# Patient Record
Sex: Female | Born: 1985 | Race: White | Hispanic: No | Marital: Married | Smoking: Current every day smoker
Health system: Southern US, Community
[De-identification: ages and names within clinical notes are randomized; demographics above are authoritative.]

## PROBLEM LIST (undated history)

## (undated) DIAGNOSIS — T7840XA Allergy, unspecified, initial encounter: Secondary | ICD-10-CM

## (undated) DIAGNOSIS — N809 Endometriosis, unspecified: Secondary | ICD-10-CM

## (undated) HISTORY — PX: TOTAL ABDOMINAL HYSTERECTOMY: SHX209

---
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
## 2005-05-09 ENCOUNTER — Other Ambulatory Visit: Admission: RE | Admit: 2005-05-09 | Discharge: 2005-05-09 | Payer: Self-pay | Admitting: Obstetrics & Gynecology

## 2016-08-30 ENCOUNTER — Other Ambulatory Visit (HOSPITAL_COMMUNITY): Payer: Self-pay | Admitting: Obstetrics and Gynecology

## 2016-08-30 DIAGNOSIS — R102 Pelvic and perineal pain: Secondary | ICD-10-CM

## 2016-09-02 ENCOUNTER — Other Ambulatory Visit (HOSPITAL_COMMUNITY): Payer: Self-pay | Admitting: Obstetrics and Gynecology

## 2016-09-02 ENCOUNTER — Ambulatory Visit (HOSPITAL_COMMUNITY)
Admission: RE | Admit: 2016-09-02 | Discharge: 2016-09-02 | Disposition: A | Discharge status: Home / Self Care | Payer: BLUE CROSS/BLUE SHIELD | Source: Ambulatory Visit | Attending: Obstetrics and Gynecology | Admitting: Obstetrics and Gynecology

## 2016-09-02 ENCOUNTER — Encounter (INDEPENDENT_AMBULATORY_CARE_PROVIDER_SITE_OTHER): Payer: Self-pay

## 2016-09-02 DIAGNOSIS — R102 Pelvic and perineal pain: Secondary | ICD-10-CM

## 2016-09-02 DIAGNOSIS — N979 Female infertility, unspecified: Secondary | ICD-10-CM | POA: Diagnosis not present

## 2016-09-02 MED ORDER — IOPAMIDOL (ISOVUE-300) INJECTION 61%
30.0000 mL | Freq: Once | INTRAVENOUS | Status: AC | PRN
  Administered 2016-09-02: 1 mL

## 2020-12-09 ENCOUNTER — Encounter (HOSPITAL_COMMUNITY): Payer: Self-pay

## 2020-12-09 ENCOUNTER — Other Ambulatory Visit: Payer: Self-pay

## 2020-12-09 ENCOUNTER — Emergency Department (HOSPITAL_COMMUNITY)
Admission: EM | Admit: 2020-12-09 | Discharge: 2020-12-10 | Disposition: A | Discharge status: Home / Self Care | Payer: 59 | Attending: Emergency Medicine | Admitting: Emergency Medicine

## 2020-12-09 DIAGNOSIS — Z20822 Contact with and (suspected) exposure to covid-19: Secondary | ICD-10-CM | POA: Diagnosis not present

## 2020-12-09 DIAGNOSIS — N83202 Unspecified ovarian cyst, left side: Secondary | ICD-10-CM

## 2020-12-09 DIAGNOSIS — R1084 Generalized abdominal pain: Secondary | ICD-10-CM | POA: Insufficient documentation

## 2020-12-09 DIAGNOSIS — R102 Pelvic and perineal pain: Secondary | ICD-10-CM

## 2020-12-09 DIAGNOSIS — R197 Diarrhea, unspecified: Secondary | ICD-10-CM | POA: Insufficient documentation

## 2020-12-09 DIAGNOSIS — R109 Unspecified abdominal pain: Secondary | ICD-10-CM

## 2020-12-09 DIAGNOSIS — R112 Nausea with vomiting, unspecified: Secondary | ICD-10-CM | POA: Insufficient documentation

## 2020-12-09 DIAGNOSIS — F17200 Nicotine dependence, unspecified, uncomplicated: Secondary | ICD-10-CM | POA: Insufficient documentation

## 2020-12-09 LAB — CBC
HCT: 35.9 % — ABNORMAL LOW (ref 36.0–46.0)
Hemoglobin: 11.6 g/dL — ABNORMAL LOW (ref 12.0–15.0)
MCH: 30 pg (ref 26.0–34.0)
MCHC: 32.3 g/dL (ref 30.0–36.0)
MCV: 92.8 fL (ref 80.0–100.0)
Platelets: 242 10*3/uL (ref 150–400)
RBC: 3.87 MIL/uL (ref 3.87–5.11)
RDW: 13.8 % (ref 11.5–15.5)
WBC: 17 10*3/uL — ABNORMAL HIGH (ref 4.0–10.5)
nRBC: 0 % (ref 0.0–0.2)

## 2020-12-09 LAB — URINALYSIS, ROUTINE W REFLEX MICROSCOPIC
Bilirubin Urine: NEGATIVE
Glucose, UA: NEGATIVE mg/dL
Ketones, ur: NEGATIVE mg/dL
Leukocytes,Ua: NEGATIVE
Nitrite: NEGATIVE
Protein, ur: NEGATIVE mg/dL
Specific Gravity, Urine: >1.03 — ABNORMAL HIGH (ref 1.005–1.030)
pH: 5.5 (ref 5.0–8.0)

## 2020-12-09 LAB — COMPREHENSIVE METABOLIC PANEL
ALT: 16 U/L (ref 0–44)
AST: 17 U/L (ref 15–41)
Albumin: 4.3 g/dL (ref 3.5–5.0)
Alkaline Phosphatase: 89 U/L (ref 38–126)
Anion gap: 10 (ref 5–15)
BUN: 14 mg/dL (ref 6–20)
CO2: 23 mmol/L (ref 22–32)
Calcium: 9 mg/dL (ref 8.9–10.3)
Chloride: 103 mmol/L (ref 98–111)
Creatinine, Ser: 0.81 mg/dL (ref 0.44–1.00)
GFR, Estimated: >60 mL/min (ref >60)
Glucose, Bld: 120 mg/dL — ABNORMAL HIGH (ref 70–99)
Potassium: 3.7 mmol/L (ref 3.5–5.1)
Sodium: 136 mmol/L (ref 135–145)
Total Bilirubin: 0.4 mg/dL (ref 0.3–1.2)
Total Protein: 7.9 g/dL (ref 6.5–8.1)

## 2020-12-09 LAB — I-STAT BETA HCG BLOOD, ED (MC, WL, AP ONLY): I-stat hCG, quantitative: <5 m[IU]/mL (ref <5)

## 2020-12-09 LAB — LIPASE, BLOOD: Lipase: 25 U/L (ref 11–51)

## 2020-12-09 LAB — URINALYSIS, MICROSCOPIC (REFLEX)
Bacteria, UA: NONE SEEN
WBC, UA: NONE SEEN WBC/hpf (ref 0–5)

## 2020-12-09 MED ORDER — ONDANSETRON 4 MG PO TBDP
4.0000 mg | ORAL_TABLET | Freq: Once | ORAL | Status: AC | PRN
  Administered 2020-12-09: 21:00:00 4 mg via ORAL
  Filled 2020-12-09: qty 1

## 2020-12-09 NOTE — ED Triage Notes (Signed)
Pt reports severe abdominal pain suddenly starting tonight at 1930 while at church. Pt says pain is generalized affecting upper and lower quadrants.

## 2020-12-10 ENCOUNTER — Emergency Department (HOSPITAL_COMMUNITY): Payer: 59

## 2020-12-10 DIAGNOSIS — F17200 Nicotine dependence, unspecified, uncomplicated: Secondary | ICD-10-CM | POA: Diagnosis not present

## 2020-12-10 DIAGNOSIS — Z20822 Contact with and (suspected) exposure to covid-19: Secondary | ICD-10-CM | POA: Diagnosis not present

## 2020-12-10 DIAGNOSIS — R109 Unspecified abdominal pain: Secondary | ICD-10-CM | POA: Diagnosis present

## 2020-12-10 DIAGNOSIS — R112 Nausea with vomiting, unspecified: Secondary | ICD-10-CM | POA: Diagnosis not present

## 2020-12-10 DIAGNOSIS — R197 Diarrhea, unspecified: Secondary | ICD-10-CM | POA: Diagnosis not present

## 2020-12-10 DIAGNOSIS — R1084 Generalized abdominal pain: Secondary | ICD-10-CM | POA: Diagnosis not present

## 2020-12-10 LAB — GC/CHLAMYDIA PROBE AMP (~~LOC~~) NOT AT ARMC
Chlamydia: NEGATIVE
Comment: NEGATIVE
Comment: NORMAL
Neisseria Gonorrhea: NEGATIVE

## 2020-12-10 LAB — WET PREP, GENITAL
Clue Cells Wet Prep HPF POC: NONE SEEN
Sperm: NONE SEEN
Trich, Wet Prep: NONE SEEN
Yeast Wet Prep HPF POC: NONE SEEN

## 2020-12-10 LAB — RESP PANEL BY RT-PCR (FLU A&B, COVID) ARPGX2
Influenza A by PCR: NEGATIVE
Influenza B by PCR: NEGATIVE
SARS Coronavirus 2 by RT PCR: NEGATIVE

## 2020-12-10 MED ORDER — SODIUM CHLORIDE 0.9 % IV BOLUS (SEPSIS)
1000.0000 mL | Freq: Once | INTRAVENOUS | Status: AC
  Administered 2020-12-10: 01:00:00 1000 mL via INTRAVENOUS

## 2020-12-10 MED ORDER — LACTATED RINGERS IV BOLUS
1000.0000 mL | Freq: Once | INTRAVENOUS | Status: AC
  Administered 2020-12-10: 05:00:00 1000 mL via INTRAVENOUS

## 2020-12-10 MED ORDER — SODIUM CHLORIDE 0.9 % IV SOLN: INTRAVENOUS | Status: DC

## 2020-12-10 MED ORDER — IOHEXOL 300 MG/ML  SOLN
100.0000 mL | Freq: Once | INTRAMUSCULAR | Status: AC | PRN
  Administered 2020-12-10: 100 mL via INTRAVENOUS

## 2020-12-10 MED ORDER — MORPHINE SULFATE (PF) 4 MG/ML IV SOLN: 4.0000 mg | INTRAVENOUS | Status: DC | PRN

## 2020-12-10 MED ORDER — OXYCODONE-ACETAMINOPHEN 5-325 MG PO TABS
1.0000 | ORAL_TABLET | Freq: Once | ORAL | Status: AC
  Administered 2020-12-10: 05:00:00 1 via ORAL
  Filled 2020-12-10: qty 1

## 2020-12-10 MED ORDER — ONDANSETRON HCL 4 MG/2ML IJ SOLN
4.0000 mg | Freq: Once | INTRAMUSCULAR | Status: AC
  Administered 2020-12-10: 01:00:00 4 mg via INTRAVENOUS
  Filled 2020-12-10: qty 2

## 2020-12-10 MED ORDER — OXYCODONE-ACETAMINOPHEN 5-325 MG PO TABS: 1.0000 | ORAL_TABLET | Freq: Four times a day (QID) | ORAL | 0 refills | Status: DC | PRN

## 2020-12-10 NOTE — ED Provider Notes (Signed)
TIME SEEN: 12:09 AM  CHIEF COMPLAINT: Abdominal pain, vomiting and diarrhea  HPI: Patient is a 34 year old female with no significant past medical history who presents to the emergency department with diffuse sharp abdominal pain that she describes as severe in nature that started between 7 and 7:30 PM.  Reports she had nausea, vomiting and diarrhea.  No fevers or chills.  No sick contacts or recent travel.  Denies dysuria, hematuria, vaginal bleeding or discharge.  No history of previous abdominal surgeries.  Reports feeling better after receiving Zofran.  ROS: See HPI Constitutional: no fever  Eyes: no drainage  ENT: no runny nose   Cardiovascular:  no chest pain  Resp: no SOB  GI: Vomiting and diarrhea GU: no dysuria Integumentary: no rash  Allergy: no hives  Musculoskeletal: no leg swelling  Neurological: no slurred speech ROS otherwise negative  PAST MEDICAL HISTORY/PAST SURGICAL HISTORY:  History reviewed. No pertinent past medical history.  MEDICATIONS:  Prior to Admission medications   Not on File    ALLERGIES:  No Known Allergies  SOCIAL HISTORY:  Social History   Tobacco Use  . Smoking status: Current Every Day Smoker  . Smokeless tobacco: Never Used  Substance Use Topics  . Alcohol use: Not Currently    FAMILY HISTORY: No family history on file.  EXAM: BP 99/84 (BP Location: Left Arm)   Pulse (!) 102   Temp 98.6 F (37 C) (Oral)   Resp 16   Ht 5\' 5"  (1.651 m)   Wt 74.8 kg   SpO2 100%   BMI 27.46 kg/m  CONSTITUTIONAL: Alert and oriented and responds appropriately to questions. Well-appearing; well-nourished HEAD: Normocephalic EYES: Conjunctivae clear, pupils appear equal, EOM appear intact ENT: normal nose; moist mucous membranes NECK: Supple, normal ROM CARD: RRR; S1 and S2 appreciated; no murmurs, no clicks, no rubs, no gallops RESP: Normal chest excursion without splinting or tachypnea; breath sounds clear and equal bilaterally; no wheezes,  no rhonchi, no rales, no hypoxia or respiratory distress, speaking full sentences ABD/GI: Normal bowel sounds; non-distended; soft, diffusely tender with intermittent voluntary guarding, no rebound GU:  Normal external genitalia. No lesions, rashes noted.  Patient has minimal amount of brown vaginal discharge seen in her posterior vaginal vault.  No other discharge noted.  Patient has left adnexal tenderness without fullness or mass appreciated.  No right adnexal tenderness, mass or fullness, no cervical motion tenderness. Cervix is not appear friable.  Cervix is closed.  Chaperone present for exam. BACK:  The back appears normal EXT: Normal ROM in all joints; no deformity noted, no edema; no cyanosis SKIN: Normal color for age and race; warm; no rash on exposed skin NEURO: Moves all extremities equally PSYCH: The patient's mood and manner are appropriate.   MEDICAL DECISION MAKING: Patient here with diffuse abdominal tenderness, vomiting and diarrhea.  Differential includes viral gastroenteritis, colitis, diverticulitis.  She has no localized tenderness on exam.  No GU symptoms.  Labs show a leukocytosis of 17,000 but otherwise unremarkable.  Urine shows no sign of infection.  Pregnancy test negative.  We will proceed with CT imaging.  She declines any pain medicine at this time stating she is feeling better request a second dose of Zofran.  ED PROGRESS: Contacted by Dr. with radiology.  Patient has a large left ovarian cyst and signs concerning for a left ovarian torsion.  Pelvic exam performed patient does have a small amount of dark red blood in the vaginal vault and has left adnexal  tenderness without mass appreciated.  Will discuss with her OB/GYN on-call.  She sees Dr. Waynard Reeds with [facility].  No previous history of pregnancy or STD.  Monogamous with one female partner.  1:40 AM  Spoke with Dr. Tenny Craw on call for [facility].  Patient will need to go to [facility] admissions.   Will discuss with [facility] for emergent transfer.   We will keep patient n.p.o.  Covid swab pending.  Will start IV fluids.  She declines any pain medication at this time.  Patient updated with plan of care.   2:05 AM  [facility]  [facility] at bedside prior to ultrasound being completed.  I had spoke to Dr. Tenny Craw.  [facility] obtained at [facility].  CRITICAL CARE Performed by: Rochele Raring   Total critical care time: 55 minutes  Critical care time was exclusive of separately billable procedures and treating other patients.  Critical care was necessary to treat or prevent imminent or life-threatening deterioration.  Critical care was time spent personally by me on the following activities: development of treatment plan with patient and/or surrogate as well as nursing, discussions with consultants, evaluation of patient's response to treatment, examination of patient, obtaining history from patient or surrogate, ordering and performing treatments and interventions, ordering and review of laboratory studies, ordering and review of radiographic studies, pulse oximetry and re-evaluation of patient's condition.   Claudia Fuller was evaluated in Emergency Department on 12/10/2020 for the symptoms described in the history of present illness. She was evaluated in the context of the global COVID-19 pandemic, which necessitated consideration that the patient might be at risk for infection with the SARS-CoV-2 virus that causes COVID-19. Institutional protocols and algorithms that pertain to the evaluation of patients at risk for COVID-19 are in a state of rapid change based on information released by regulatory bodies including the CDC and federal and state organizations. These policies and algorithms were followed during the patient's care in the ED.      [name], [name], DO 12/10/20 [phone]

## 2020-12-10 NOTE — ED Provider Notes (Signed)
2:38 AM Assumed care from Dr. Elesa Massed, please see their note for full history, physical and decision making until this point. In brief this is a 34 y.o. year old female who presented to the ED tonight with Abdominal Pain     Apparently patient was supposed to go to [facility] for [place] and Gyn consultation however they refused to accept her on arrival so is now in ED.  I immediately called Dr. Tenny Craw who stated patient should be in [facility] but stated to get an [place] then call back with results.   [place] ok. Pain controlled the whole time in ED. Discussed with Dr. Tenny Craw who recommends discharge w/ pain meds and close follow up which her office will try to coordinate. Described symptoms to patient and husband.   Discharge instructions, including strict return precautions for new or worsening symptoms, given. Patient and/or family verbalized understanding and agreement with the plan as described.   Labs, studies and imaging reviewed by myself and considered in medical decision making if ordered. Imaging interpreted by radiology.  Labs Reviewed  WET PREP, GENITAL - Abnormal; Notable for the following components:      Result Value   WBC, Wet Prep HPF POC FEW (*)    All other components within normal limits  COMPREHENSIVE METABOLIC PANEL - Abnormal; Notable for the following components:   Glucose, Bld 120 (*)    All other components within normal limits  CBC - Abnormal; Notable for the following components:   WBC 17.0 (*)    Hemoglobin 11.6 (*)    HCT 35.9 (*)    All other components within normal limits  URINALYSIS, ROUTINE W REFLEX MICROSCOPIC - Abnormal; Notable for the following components:   Specific Gravity, Urine >1.030 (*)    Hgb urine dipstick TRACE (*)    All other components within normal limits  RESP PANEL BY RT-PCR (FLU A&B, COVID) ARPGX2  LIPASE, BLOOD  URINALYSIS, MICROSCOPIC (REFLEX)  I-STAT BETA HCG BLOOD, ED ([facility], [facility], [facility] ONLY)  GC/CHLAMYDIA PROBE AMP ([place]) NOT AT [facility]    CT ABDOMEN PELVIS  W CONTRAST  Final Result  Addendum 1 of 1  ADDENDUM REPORT: 12/10/2020 01:34    ADDENDUM:  These results were called by telephone at the time of interpretation  on 12/10/2020 at 1:34 am to provider <Referring (Ordering)  Provider>, who verbally acknowledged these results.      Electronically Signed    By: Kreg Shropshire M.D.    On: 12/10/2020 01:34      Final    US Pelvis Complete    (Results Pending)  US Transvaginal Non-OB    (Results Pending)  [place] Art/Ven Flow Abd Pelv Doppler    (Results Pending)    No follow-ups on file.    Claudia Fuller, Claudia Cower, MD 12/11/20 0330

## 2020-12-10 NOTE — ED Notes (Signed)
Report given to [facility] in pt room.

## 2020-12-10 NOTE — ED Notes (Signed)
Attempted to call report to [facility]. Was placed on hold for 41sec and was disconnected.

## 2020-12-10 NOTE — ED Notes (Signed)
Attempted to call report, Charge Nurse unable to take report at this time.

## 2020-12-10 NOTE — ED Notes (Signed)
Patient transported to Ultrasound 

## 2020-12-10 NOTE — ED Notes (Signed)
Family called and notified that pt will be going to [facility]

## 2020-12-10 NOTE — ED Notes (Addendum)
Pt arrives via [facility], VSS, [facility] gave 4mg  morphine PTA

## 2020-12-15 MED FILL — Morphine Sulfate Inj 4 MG/ML: INTRAMUSCULAR | Qty: 1 | Status: AC

## 2021-11-29 IMAGING — CT CT ABD-PELV W/ CM
2 of 4 series · 12 of 46 positions shown, 14 images · IV contrast (OMNIPAQUE 300)
Comparison: None.
COMPARISON: None.

Addendum:
CLINICAL DATA: Nonlocalized abdominal pain

EXAM:
CT ABDOMEN AND PELVIS WITH CONTRAST
TECHNIQUE: Multidetector CT imaging of the abdomen and pelvis was performed
using the standard protocol following bolus administration of
intravenous contrast.
CONTRAST:  100mL OMNIPAQUE IOHEXOL 300 MG/ML  SOLN

[Series 2: axial st · axial · 0.68mm/px · z∈[+1092,+1488]mm · 9 of 94 slices shown, 11 images]
[im 10/94  soft-tissue]
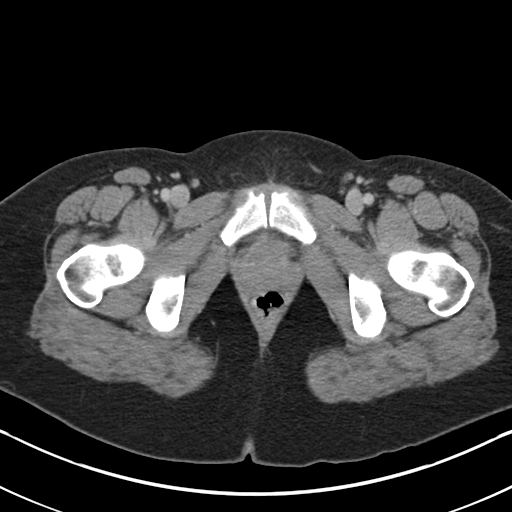
[im 10/94  bone]
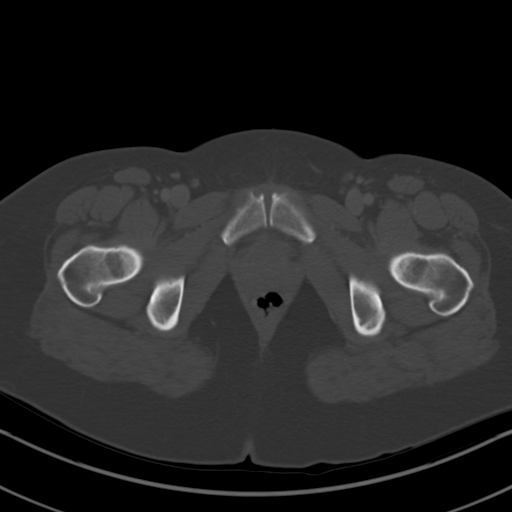
[im 20/94  soft-tissue]
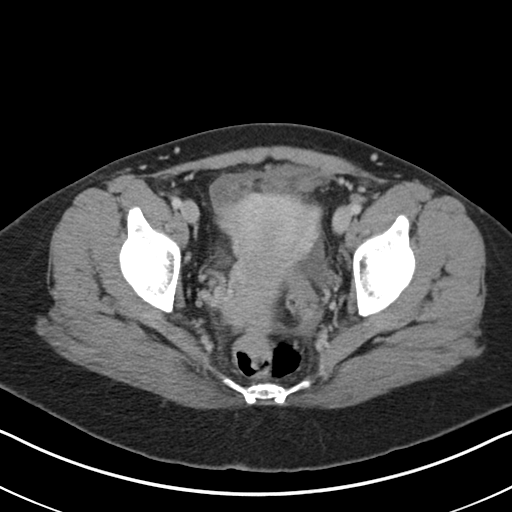
[im 30/94  soft-tissue]
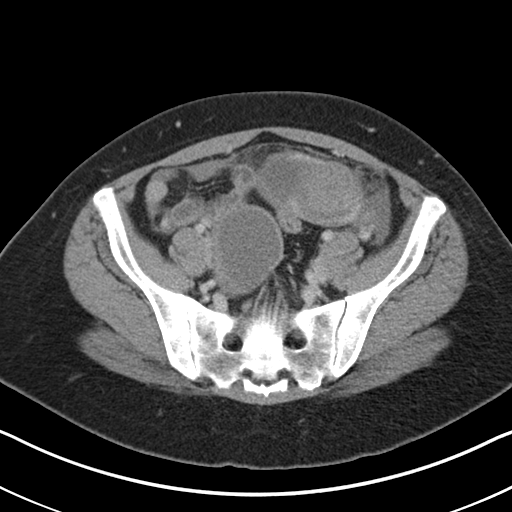
[im 40/94  soft-tissue]
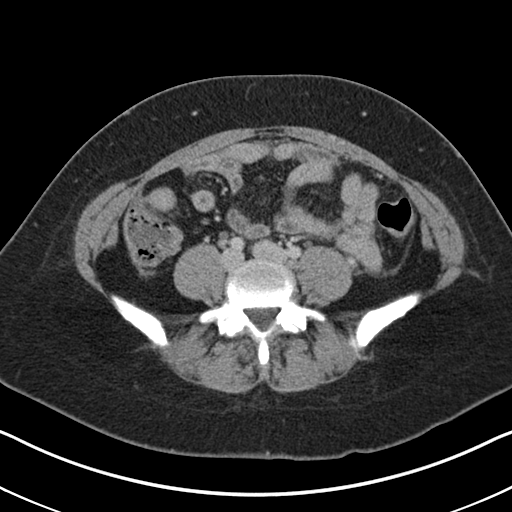
[im 49/94  soft-tissue]
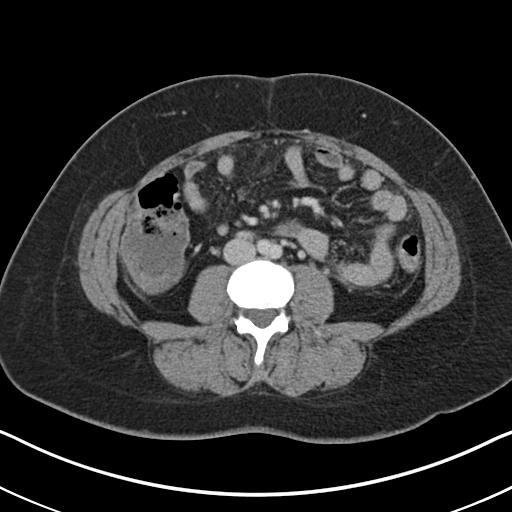
[im 59/94  soft-tissue]
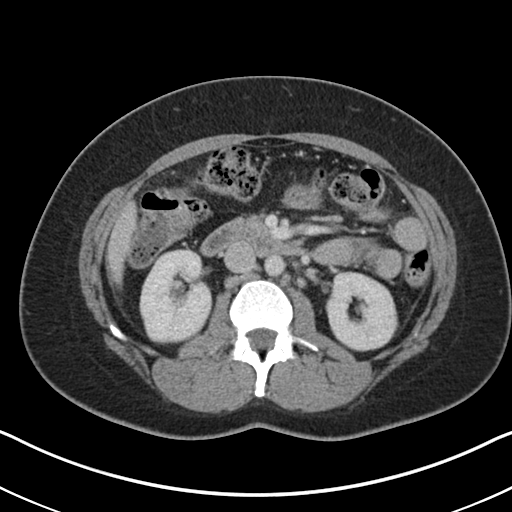
[im 69/94  soft-tissue]
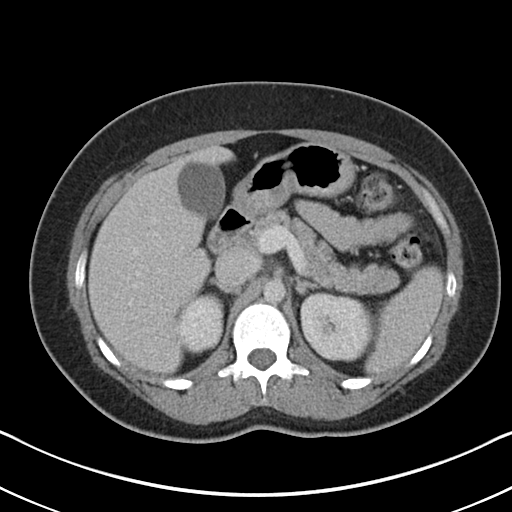
[im 79/94  soft-tissue]
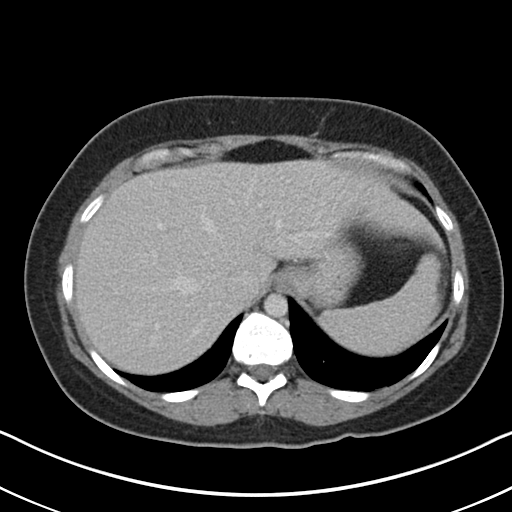
[im 89/94  soft-tissue]
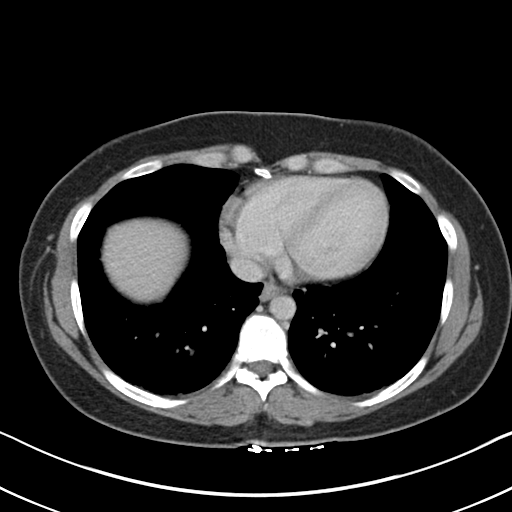
[im 89/94  bone]
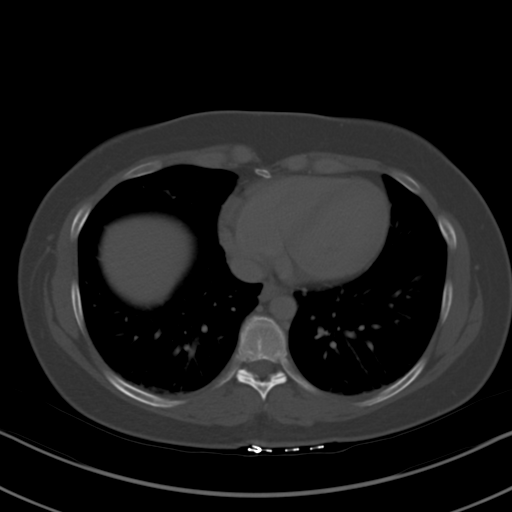

[Series 4: coronal st · coronal · 0.70mm/px · 3 of 126 slices shown]
[im 42/126  soft-tissue]
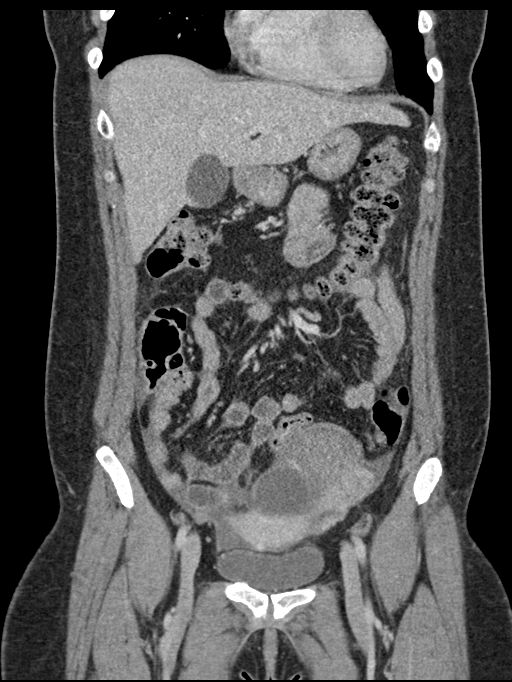
[im 56/126  soft-tissue]
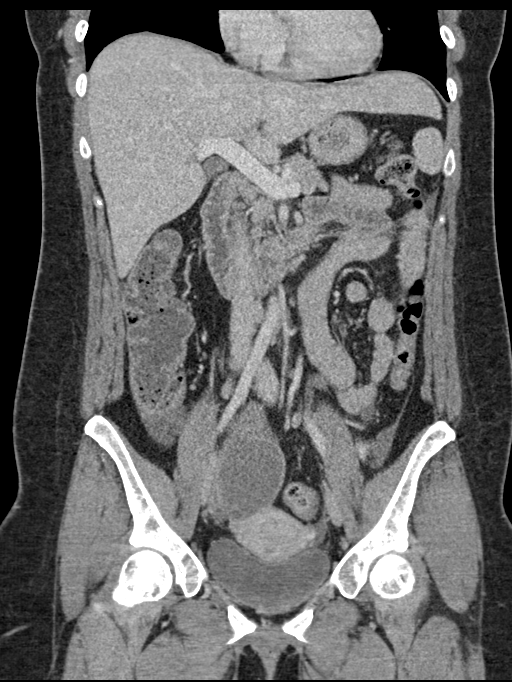
[im 70/126  soft-tissue]
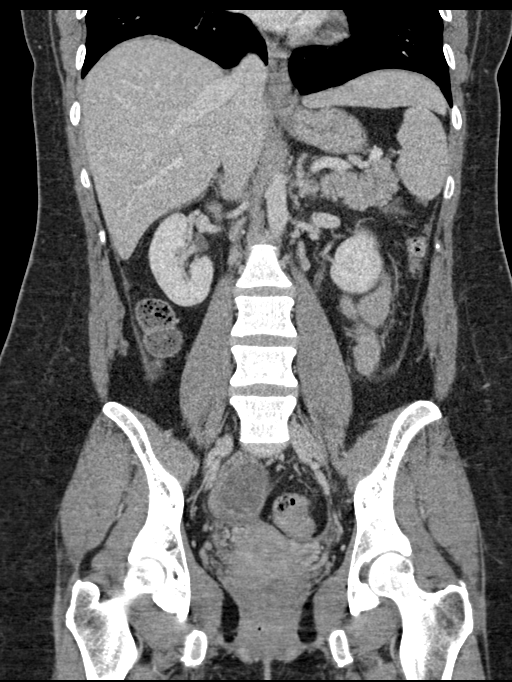

[12 of 46 positions shown; findings below may reference images not displayed]

FINDINGS: Lower chest: Lung bases are clear. Normal heart size. No pericardial
effusion.

Hepatobiliary: No worrisome focal liver lesions. Smooth liver
surface contour. Normal hepatic attenuation. Normal gallbladder and
biliary tree.

Pancreas: No pancreatic ductal dilatation or surrounding
inflammatory changes.

Spleen: Normal in size. No concerning splenic lesions.

Adrenals/Urinary Tract: Normal adrenals. Kidneys are normally
located with symmetric enhancement. No suspicious renal lesion,
urolithiasis or hydronephrosis. Urinary bladder is unremarkable.

Stomach/Bowel: Distal esophagus, stomach and duodenal sweep are
unremarkable. No small bowel wall thickening or dilatation. No
evidence of obstruction. A normal appendix is visualized. No colonic
dilatation or wall thickening.

Vascular/Lymphatic: No significant vascular findings are present. No
enlarged abdominal or pelvic lymph nodes.

Reproductive: Thick-walled, centrally fluid attenuation 4.5 cm
cystic structure is present in the left ovary with an additional
more simple appearing 5.5 cm cyst as well. The ovary is displaced
slightly superomedial from the expected location along the left
pelvic sidewall, nonspecific albeit with adjacent stranding and
inflammation and given enlargement, could suggest ovarian torsion.
Additional large simple appearing cystic structure in the right
ovary measuring up to 5.3 cm in size with a smaller adjacent more
tubular cystic structure in the right adnexa, possibly small cyst or
hydrosalpinx. No focal right adnexal inflammatory changes seen.
Right adnexal fluid is favored to be redistributed. Anteverted
uterus. Hyperdense subserosal nodule towards the uterine fundus
measuring 11 mm, likely small subserosal fibroid.

Other: Small volume low-attenuation free fluid (15 HU) in the
pericolic gutters and deep pelvis most pronounced in the left adnexa
where adjacent stranding and inflammation appears centered upon an
enlarged left ovary. No free air. No bowel containing hernia. Small
fat containing umbilical hernia.

Musculoskeletal: No acute osseous abnormality or suspicious osseous
lesion.
IMPRESSION: 1. Multiple large cysts are present in both ovaries. A more
thick-walled cyst in the left ovary may reflect a corpus luteum
albeit with grossly enlarged appearance of the ovary itself,
adjacent stranding and free fluid, largely centered in the left
adnexa, and slight displacement of the left ovary superomedial from
the expected location along the left pelvic sidewall. Constellation
of findings raise concern for possible ovarian torsion versus cyst
rupture. Consider further evaluation with pelvic ultrasound with
Doppler assessment.
2. More tubular cystic structure in the right adnexa could reflect
an additional small cyst versus hydrosalpinx. Could be further
visualized at the time of pelvic ultrasound as well.
3. Small subserosal nodule towards the uterine fundus measuring 11
mm, likely small subserosal fibroid.

Currently attempting to contact the ordering provider with a
critical value result. Addendum will be submitted upon case
discussion.

ADDENDUM:
These results were called by telephone at the time of interpretation
on 12/10/2020 at [DATE] to provider <Referring (Ordering)
Provider>, who verbally acknowledged these results.

*** End of Addendum ***
FINDINGS: Lower chest: Lung bases are clear. Normal heart size. No pericardial
effusion.

Hepatobiliary: No worrisome focal liver lesions. Smooth liver
surface contour. Normal hepatic attenuation. Normal gallbladder and
biliary tree.

Pancreas: No pancreatic ductal dilatation or surrounding
inflammatory changes.

Spleen: Normal in size. No concerning splenic lesions.

Adrenals/Urinary Tract: Normal adrenals. Kidneys are normally
located with symmetric enhancement. No suspicious renal lesion,
urolithiasis or hydronephrosis. Urinary bladder is unremarkable.

Stomach/Bowel: Distal esophagus, stomach and duodenal sweep are
unremarkable. No small bowel wall thickening or dilatation. No
evidence of obstruction. A normal appendix is visualized. No colonic
dilatation or wall thickening.

Vascular/Lymphatic: No significant vascular findings are present. No
enlarged abdominal or pelvic lymph nodes.

Reproductive: Thick-walled, centrally fluid attenuation 4.5 cm
cystic structure is present in the left ovary with an additional
more simple appearing 5.5 cm cyst as well. The ovary is displaced
slightly superomedial from the expected location along the left
pelvic sidewall, nonspecific albeit with adjacent stranding and
inflammation and given enlargement, could suggest ovarian torsion.
Additional large simple appearing cystic structure in the right
ovary measuring up to 5.3 cm in size with a smaller adjacent more
tubular cystic structure in the right adnexa, possibly small cyst or
hydrosalpinx. No focal right adnexal inflammatory changes seen.
Right adnexal fluid is favored to be redistributed. Anteverted
uterus. Hyperdense subserosal nodule towards the uterine fundus
measuring 11 mm, likely small subserosal fibroid.

Other: Small volume low-attenuation free fluid (15 HU) in the
pericolic gutters and deep pelvis most pronounced in the left adnexa
where adjacent stranding and inflammation appears centered upon an
enlarged left ovary. No free air. No bowel containing hernia. Small
fat containing umbilical hernia.

Musculoskeletal: No acute osseous abnormality or suspicious osseous
lesion.
IMPRESSION: 1. Multiple large cysts are present in both ovaries. A more
thick-walled cyst in the left ovary may reflect a corpus luteum
albeit with grossly enlarged appearance of the ovary itself,
adjacent stranding and free fluid, largely centered in the left
adnexa, and slight displacement of the left ovary superomedial from
the expected location along the left pelvic sidewall. Constellation
of findings raise concern for possible ovarian torsion versus cyst
rupture. Consider further evaluation with pelvic ultrasound with
Doppler assessment.
2. More tubular cystic structure in the right adnexa could reflect
an additional small cyst versus hydrosalpinx. Could be further
visualized at the time of pelvic ultrasound as well.
3. Small subserosal nodule towards the uterine fundus measuring 11
mm, likely small subserosal fibroid.

Currently attempting to contact the ordering provider with a
critical value result. Addendum will be submitted upon case
discussion.

## 2021-11-29 IMAGING — US US PELVIS COMPLETE TRANSABD/TRANSVAG W DUPLEX
1 series · 12 of 25 positions shown · non-contrast
Comparison: CT 12/10/2020

CLINICAL DATA: Concern for left ovarian torsion on CT.

EXAM:
TRANSABDOMINAL ULTRASOUND OF PELVIS
DOPPLER ULTRASOUND OF OVARIES
TECHNIQUE: Transabdominal ultrasound examination of the pelvis was performed
including evaluation of the uterus, ovaries, adnexal regions, and
pelvic cul-de-sac.
Color and duplex Doppler ultrasound was utilized to evaluate blood
flow to the ovaries.

[Series 1: us pelvis (transabdominal only) · 81 acquisitions, 12 frames shown]
[im 4/81]
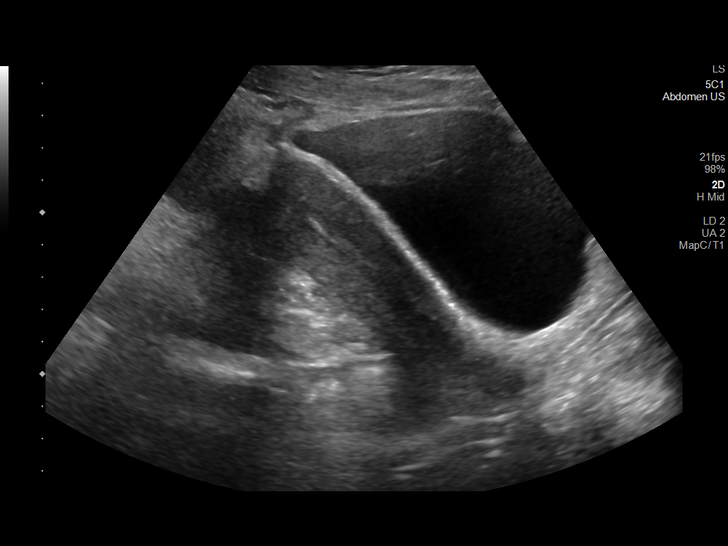
[im 11/81]
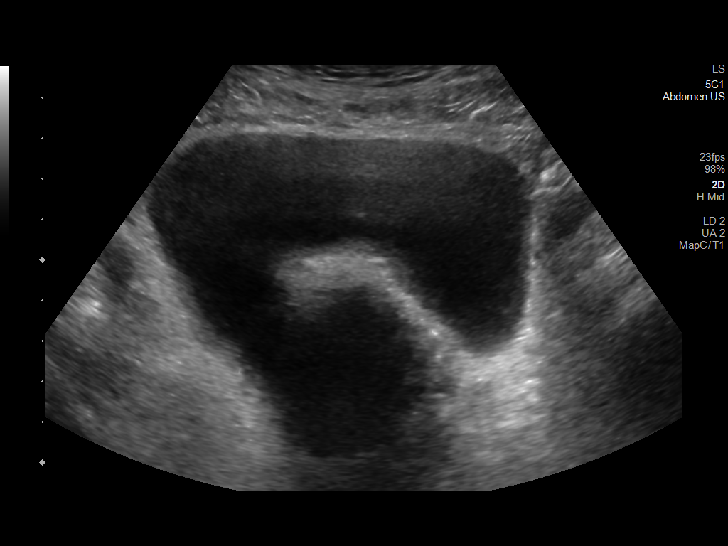
[im 17/81]
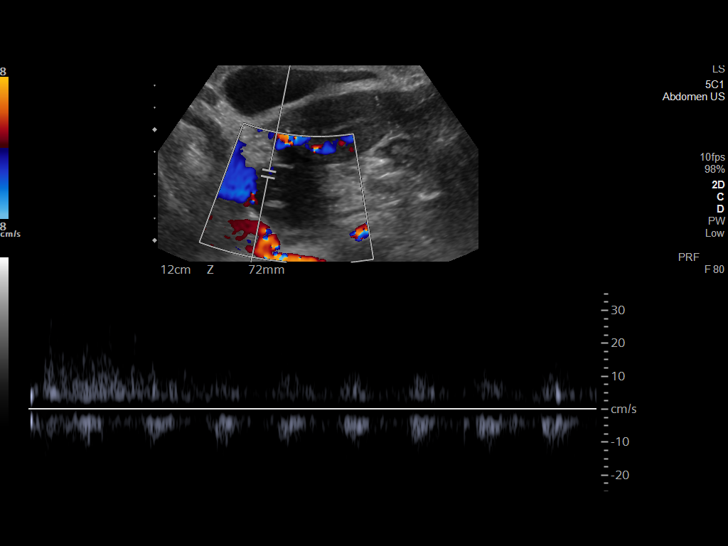
[im 24/81]
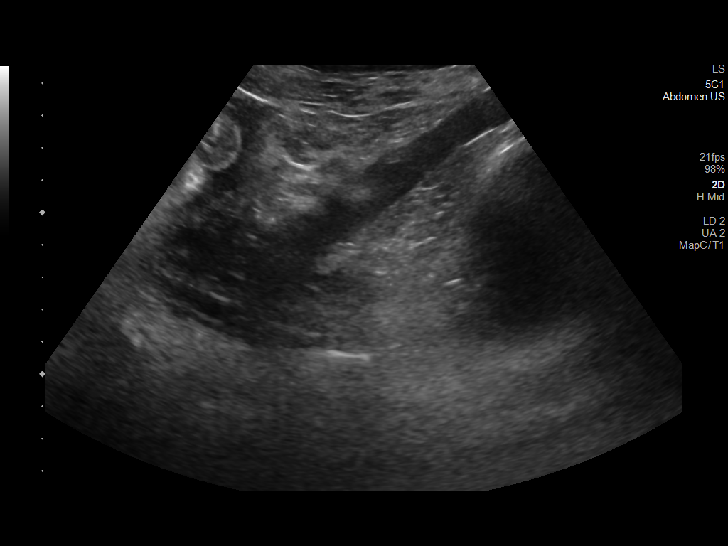
[im 31/81]
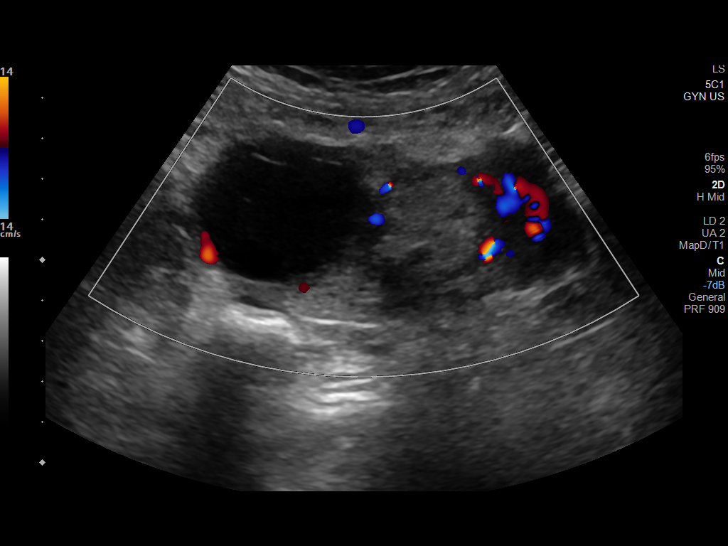
[im 37/81]
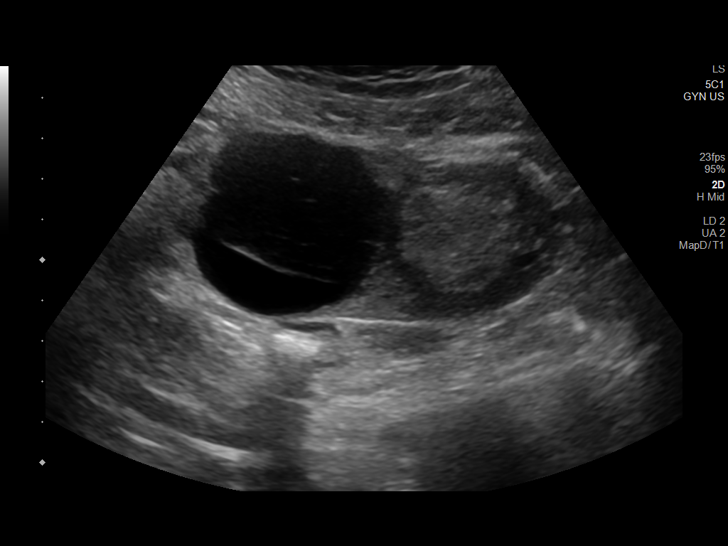
[im 44/81]
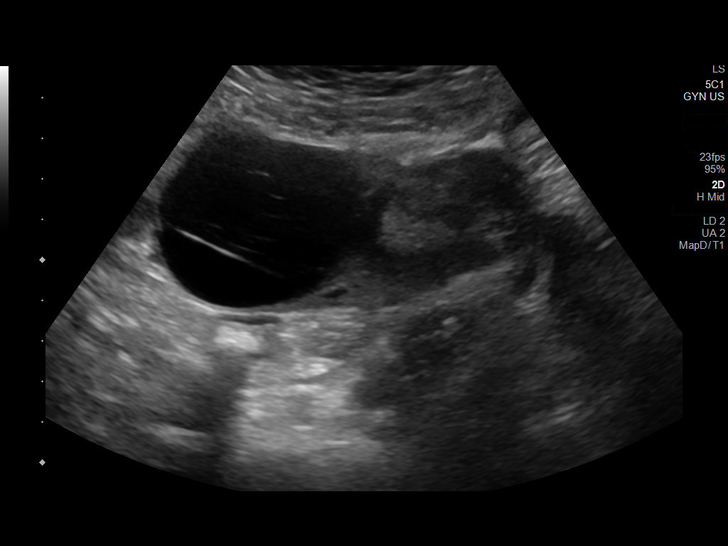
[im 51/81]
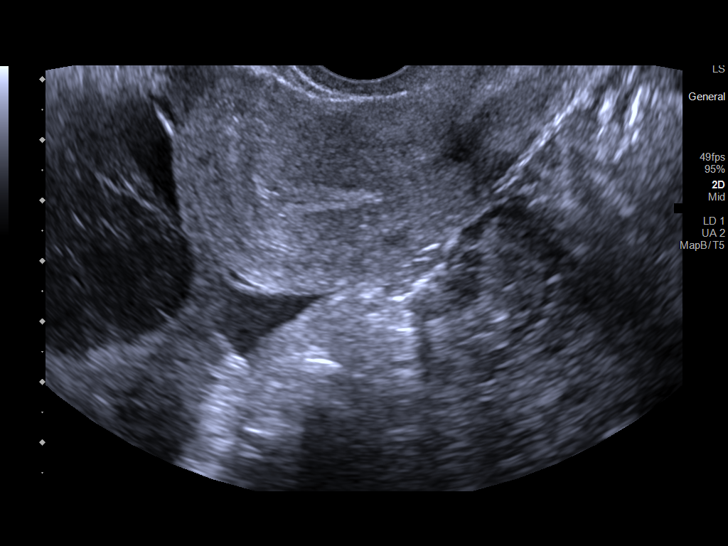
[im 57/81]
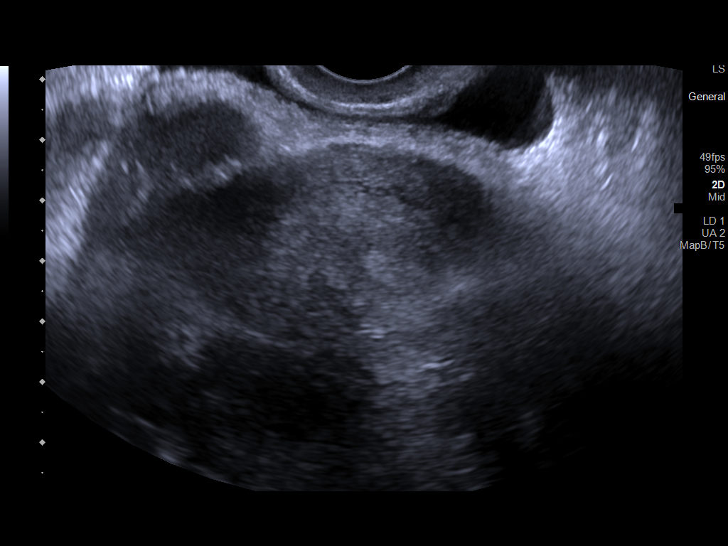
[im 64/81]
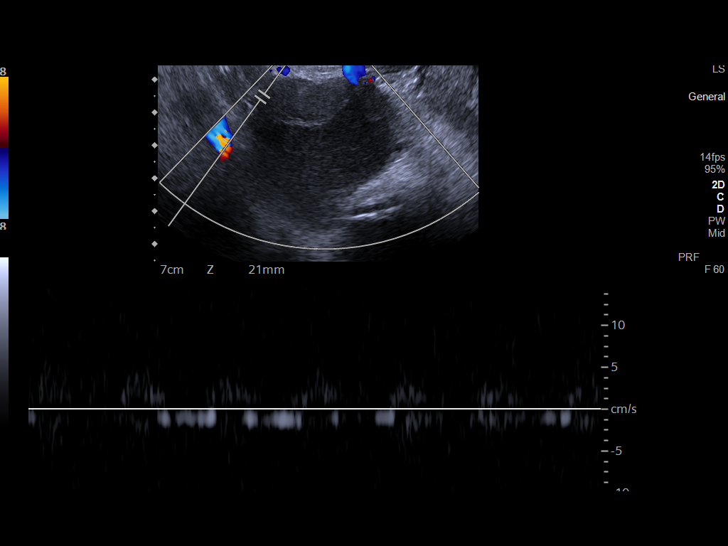
[im 71/81]
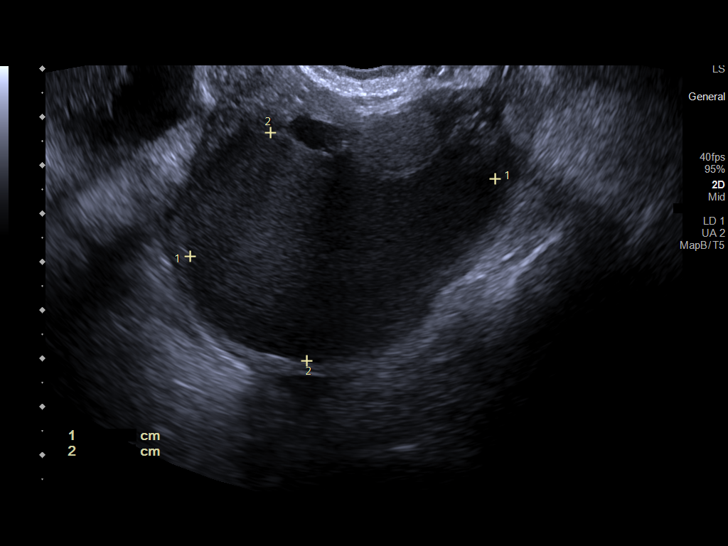
[im 77/81]
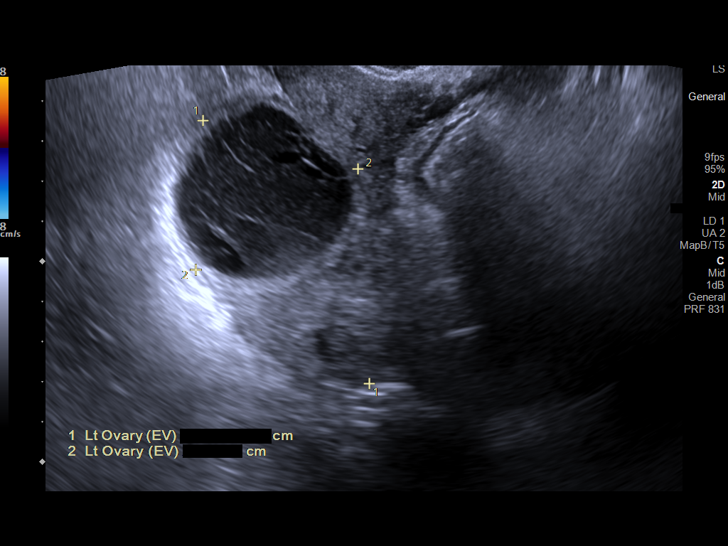

[12 of 25 positions shown; findings below may reference images not displayed]

FINDINGS: Uterus

Measurements: 10.6 x 2.7 x 5.4 cm = volume: 79.28 mL. Anteverted
uterus. No focal uterine lesions. Arcuate configuration of the
uterine fundus is better visualized on CT.

Endometrium

Thickness: 6.4 mm, non thickened.  No focal abnormality visualized.

Right ovary

Measurements: 7.8 x 5.7 x 5.2 cm = volume: 121 mL. There is a 6.5 x
4.8 x 4.6 cm ovoid cystic structure in the right adnexa with
homogeneous low internal echoes suggestive of endometrioma.
Additionally, partially visualized within the sonographic images is
a questionable anechoic tubular structure along the superior aspect
of the cystic lesion between the margin of the ovary and uterine
fundus best seen on sagittal uterine transvaginal image 52/81.
Incompletely characterized does remain suggestive possible
hydrosalpinx.

Left ovary

Measurements: 8.2 x 4.0 x 9.1 cm = volume: 155 mL. 4.7 x 4.3 x
cm rounded cystic lesion with several solid-appearing areas with
concave margins and reticular internal echoes more suggestive of a
hemorrhagic cyst. An additional more thick-walled heterogeneous
appearing structure is seen immediately adjacent measuring
approximately 3.1 x 3.0 x 3.2 cm in size (109/116 for example).

Pulsed Doppler evaluation demonstrates normal low-resistance
arterial and venous waveforms in both ovaries.

Other: There is a small volume echogenic fluid in the pelvis which
could reflect proteinaceous material or hemorrhagic products such as
from cyst rupture.
IMPRESSION: 1. Normal Doppler evaluation without evidence of ovarian torsion.
2. 4.7 cm rounded cystic lesion in the left ovary with internal
solid components demonstrating some concave margins and reticular
internal echoes is suggestive of a hemorrhagic cyst. Additional more
thick-walled heterogeneous structure immediately adjacent to the
cystic lesion. More indeterminate appearance though could feasibly
reflect a small corpus hemorrhagicum particularly given the presence
of echogenic free fluid in the pelvis suggesting proteinaceous
material or hemorrhage. Consider follow-up ultrasound in 6-8 weeks
to assess for resolution.
3. 6.5 cm ovoid cystic structure in the right adnexa with
homogeneous low internal echoes most suggestive of an endometrioma.
Partial visualization of an anechoic tubular structure along the
superior aspect of the cystic lesion may reflect hydrosalpinx
particularly given appearance on comparison CT. These findings could
also be better assessed at time of follow-up ultrasound.
4. Arcuate configuration of the uterine fundus, better visualized on
CT.

These results were called by telephone at the time of interpretation
on 12/10/2020 at [DATE] to provider Hernadez, who verbally
acknowledged these results.

## 2022-11-23 NOTE — Therapy (Deleted)
OUTPATIENT PHYSICAL THERAPY FEMALE PELVIC EVALUATION   Patient Name: Claudia Fuller MRN: [id number] DOB:02-Aug-1986, 36 y.o., female Today's Date: 11/23/2022  END OF SESSION:   No past medical history on file. No past surgical history on file. There are no problems to display for this patient.   PCP: none  REFERRING PROVIDER: Purvis Sheffield, MD  REFERRING DIAG: M79.18 (ICD-10-CM) - Myalgia, other site   THERAPY DIAG:  No diagnosis found.  Rationale for Evaluation and Treatment: Rehabilitation  ONSET DATE: ***  SUBJECTIVE:                                                                                                                                                                                           SUBJECTIVE STATEMENT: Had a biopsy of genital wart and mole. Using vaginal estrogen, February 2023 she underwent a TLH, BSO, treatment of endometriosis and sigmoid colectomy  Fluid intake: {Yes/No:304960894}   PAIN:  Are you having pain? {yes/no:20286} NPRS scale: ***/10 Pain location: {pelvic pain location:27098}  Pain type: {type:313116} Pain description: {PAIN DESCRIPTION:21022940}   Aggravating factors: *** Relieving factors: ***  PRECAUTIONS: {Therapy precautions:24002}  WEIGHT BEARING RESTRICTIONS: {Yes ***/No:24003}  FALLS:  Has patient fallen in last 6 months? {fallsyesno:27318}  LIVING ENVIRONMENT: Lives with: {[facility] lives with:25569::"lives with their family"} Lives in: {Lives in:25570} Stairs: {opstairs:27293} Has following equipment at home: {Assistive devices:23999}  OCCUPATION: ***  PLOF: {PLOF:24004}  PATIENT GOALS: ***  PERTINENT HISTORY:  Hysterectomy; excision of endometriosis Sexual abuse: {Yes/No:304960894}  BOWEL MOVEMENT: Pain with bowel movement: {yes/no:20286} Type of bowel movement:{PT BM type:27100} Fully empty rectum: {Yes/No:304960894} Leakage: {Yes/No:304960894} Pads: {Yes/No:304960894} Fiber supplement:  {Yes/No:304960894}  URINATION: Pain with urination: {yes/no:20286} Fully empty bladder: {Yes/No:304960894} Stream: {PT urination:27102} Urgency: {Yes/No:304960894} Frequency: *** Leakage: {PT leakage:27103} Pads: {Yes/No:304960894}  INTERCOURSE: Pain with intercourse: {pain with intercourse PA:27099} Ability to have vaginal penetration:  {Yes/No:304960894} Climax: *** Marinoff Scale: ***/3  PREGNANCY: Vaginal deliveries *** Tearing {Yes***/No:304960894} C-section deliveries *** Currently pregnant {Yes***/No:304960894}  PROLAPSE: {PT prolapse:27101}   OBJECTIVE:   DIAGNOSTIC FINDINGS:  ***  PATIENT SURVEYS:  {rehab surveys:24030}  PFIQ-7 ***  COGNITION: Overall cognitive status: {cognition:24006}     SENSATION: Light touch: {intact/deficits:24005} Proprioception: {intact/deficits:24005}  MUSCLE LENGTH: Hamstrings: Right *** deg; Left *** deg Thomas test: Right *** deg; Left *** deg  LUMBAR SPECIAL TESTS:  {lumbar special test:25242}  FUNCTIONAL TESTS:  {Functional tests:24029}  GAIT: Distance walked: *** Assistive device utilized: {Assistive devices:23999} Level of assistance: {Levels of assistance:24026} Comments: ***  POSTURE: {posture:25561}  PELVIC ALIGNMENT:  LUMBARAROM/PROM:  A/PROM A/PROM  eval  Flexion   Extension   Right lateral flexion  Left lateral flexion   Right rotation   Left rotation    (Blank rows = not tested)  LOWER EXTREMITY ROM:  {AROM/PROM:27142} ROM Right eval Left eval  Hip flexion    Hip extension    Hip abduction    Hip adduction    Hip internal rotation    Hip external rotation    Knee flexion    Knee extension    Ankle dorsiflexion    Ankle plantarflexion    Ankle inversion    Ankle eversion     (Blank rows = not tested)  LOWER EXTREMITY MMT:  MMT Right eval Left eval  Hip flexion    Hip extension    Hip abduction    Hip adduction    Hip internal rotation    Hip external rotation     Knee flexion    Knee extension    Ankle dorsiflexion    Ankle plantarflexion    Ankle inversion    Ankle eversion     PALPATION:   General  ***                External Perineal Exam ***                             Internal Pelvic Floor ***  Patient confirms identification and approves PT to assess internal pelvic floor and treatment {yes/no:20286}  PELVIC MMT:   MMT eval  Vaginal   Internal Anal Sphincter   External Anal Sphincter   Puborectalis   Diastasis Recti   (Blank rows = not tested)        TONE: ***  PROLAPSE: ***  TODAY'S TREATMENT:                                                                                                                              DATE: ***  EVAL ***   PATIENT EDUCATION:  Education details: *** Person educated: {Person educated:25204} Education method: {Education Method:25205} Education comprehension: {Education Comprehension:25206}  HOME EXERCISE PROGRAM: ***  ASSESSMENT:  CLINICAL IMPRESSION: Patient is a *** y.o. *** who was seen today for physical therapy evaluation and treatment for ***.   OBJECTIVE IMPAIRMENTS: {opptimpairments:25111}.   ACTIVITY LIMITATIONS: {activitylimitations:27494}  PARTICIPATION LIMITATIONS: {participationrestrictions:25113}  PERSONAL FACTORS: {Personal factors:25162} are also affecting patient's functional outcome.   REHAB POTENTIAL: {rehabpotential:25112}  CLINICAL DECISION MAKING: {clinical decision making:25114}  EVALUATION COMPLEXITY: {Evaluation complexity:25115}   GOALS: Goals reviewed with patient? {yes/no:20286}  SHORT TERM GOALS: Target date: ***  *** Baseline: Goal status: {GOALSTATUS:25110}  2.  *** Baseline:  Goal status: {GOALSTATUS:25110}  3.  *** Baseline:  Goal status: {GOALSTATUS:25110}  4.  *** Baseline:  Goal status: {GOALSTATUS:25110}  5.  *** Baseline:  Goal status: {GOALSTATUS:25110}  6.  *** Baseline:  Goal status:  {GOALSTATUS:25110}  LONG TERM GOALS: Target date: ***  *** Baseline:  Goal status: {GOALSTATUS:25110}  2.  *** Baseline:  Goal status: {GOALSTATUS:25110}  3.  *** Baseline:  Goal status: {GOALSTATUS:25110}  4.  *** Baseline:  Goal status: {GOALSTATUS:25110}  5.  *** Baseline:  Goal status: {GOALSTATUS:25110}  6.  *** Baseline:  Goal status: {GOALSTATUS:25110}  PLAN:  PT FREQUENCY: {rehab frequency:25116}  PT DURATION: {rehab duration:25117}  PLANNED INTERVENTIONS: {rehab planned interventions:25118::"Therapeutic exercises","Therapeutic activity","Neuromuscular re-education","Balance training","Gait training","Patient/Family education","Self Care","Joint mobilization"}  PLAN FOR NEXT SESSION: ***   Dickson Kostelnik, PT 11/23/2022, 10:51 AM

## 2022-11-24 ENCOUNTER — Encounter: Payer: 59 | Admitting: Physical Therapy

## 2022-11-30 NOTE — Therapy (Deleted)
OUTPATIENT PHYSICAL THERAPY FEMALE PELVIC EVALUATION   Patient Name: Claudia Fuller MRN: [id number] DOB:Feb 17, 1986, 36 y.o., female Today's Date: 11/30/2022  END OF SESSION:   No past medical history on file. No past surgical history on file. There are no problems to display for this patient.   PCP: None  REFERRING PROVIDER: Purvis Sheffield, MD  REFERRING DIAG: M79.18 (ICD-10-CM) - Myalgia, other site   THERAPY DIAG:  No diagnosis found.  Rationale for Evaluation and Treatment: Rehabilitation  ONSET DATE: ***  SUBJECTIVE:                                                                                                                                                                                           SUBJECTIVE STATEMENT: *** Fluid intake: {Yes/No:304960894}   PAIN:  Are you having pain? {yes/no:20286} NPRS scale: ***/10 Pain location: {pelvic pain location:27098}  Pain type: {type:313116} Pain description: {PAIN DESCRIPTION:21022940}   Aggravating factors: *** Relieving factors: ***  PRECAUTIONS: {Therapy precautions:24002}  WEIGHT BEARING RESTRICTIONS: {Yes ***/No:24003}  FALLS:  Has patient fallen in last 6 months? {fallsyesno:27318}  LIVING ENVIRONMENT: Lives with: {[facility] lives with:25569::"lives with their family"} Lives in: {Lives in:25570} Stairs: {opstairs:27293} Has following equipment at home: {Assistive devices:23999}  OCCUPATION: ***  PLOF: {PLOF:24004}  PATIENT GOALS: ***  PERTINENT HISTORY:  *** Sexual abuse: {Yes/No:304960894}  BOWEL MOVEMENT: Pain with bowel movement: {yes/no:20286} Type of bowel movement:{PT BM type:27100} Fully empty rectum: {Yes/No:304960894} Leakage: {Yes/No:304960894} Pads: {Yes/No:304960894} Fiber supplement: {Yes/No:304960894}  URINATION: Pain with urination: {yes/no:20286} Fully empty bladder: {Yes/No:304960894} Stream: {PT urination:27102} Urgency: {Yes/No:304960894} Frequency:  *** Leakage: {PT leakage:27103} Pads: {Yes/No:304960894}  INTERCOURSE: Pain with intercourse: {pain with intercourse PA:27099} Ability to have vaginal penetration:  {Yes/No:304960894} Climax: *** Marinoff Scale: ***/3  PREGNANCY: Vaginal deliveries *** Tearing {Yes***/No:304960894} C-section deliveries *** Currently pregnant {Yes***/No:304960894}  PROLAPSE: {PT prolapse:27101}   OBJECTIVE:   DIAGNOSTIC FINDINGS:  ***  PATIENT SURVEYS:  {rehab surveys:24030}  PFIQ-7 ***  COGNITION: Overall cognitive status: {cognition:24006}     SENSATION: Light touch: {intact/deficits:24005} Proprioception: {intact/deficits:24005}  MUSCLE LENGTH: Hamstrings: Right *** deg; Left *** deg Thomas test: Right *** deg; Left *** deg  LUMBAR SPECIAL TESTS:  {lumbar special test:25242}  FUNCTIONAL TESTS:  {Functional tests:24029}  GAIT: Distance walked: *** Assistive device utilized: {Assistive devices:23999} Level of assistance: {Levels of assistance:24026} Comments: ***  POSTURE: {posture:25561}  PELVIC ALIGNMENT:  LUMBARAROM/PROM:  A/PROM A/PROM  eval  Flexion   Extension   Right lateral flexion   Left lateral flexion   Right rotation   Left rotation    (Blank rows = not tested)  LOWER EXTREMITY ROM:  {AROM/PROM:27142} ROM Right  eval Left eval  Hip flexion    Hip extension    Hip abduction    Hip adduction    Hip internal rotation    Hip external rotation    Knee flexion    Knee extension    Ankle dorsiflexion    Ankle plantarflexion    Ankle inversion    Ankle eversion     (Blank rows = not tested)  LOWER EXTREMITY MMT:  MMT Right eval Left eval  Hip flexion    Hip extension    Hip abduction    Hip adduction    Hip internal rotation    Hip external rotation    Knee flexion    Knee extension    Ankle dorsiflexion    Ankle plantarflexion    Ankle inversion    Ankle eversion     PALPATION:   General  ***                External  Perineal Exam ***                             Internal Pelvic Floor ***  Patient confirms identification and approves PT to assess internal pelvic floor and treatment {yes/no:20286}  PELVIC MMT:   MMT eval  Vaginal   Internal Anal Sphincter   External Anal Sphincter   Puborectalis   Diastasis Recti   (Blank rows = not tested)        TONE: ***  PROLAPSE: ***  TODAY'S TREATMENT:                                                                                                                              DATE: ***  EVAL ***   PATIENT EDUCATION:  Education details: *** Person educated: {Person educated:25204} Education method: {Education Method:25205} Education comprehension: {Education Comprehension:25206}  HOME EXERCISE PROGRAM: ***  ASSESSMENT:  CLINICAL IMPRESSION: Patient is a *** y.o. *** who was seen today for physical therapy evaluation and treatment for ***.   OBJECTIVE IMPAIRMENTS: {opptimpairments:25111}.   ACTIVITY LIMITATIONS: {activitylimitations:27494}  PARTICIPATION LIMITATIONS: {participationrestrictions:25113}  PERSONAL FACTORS: {Personal factors:25162} are also affecting patient's functional outcome.   REHAB POTENTIAL: {rehabpotential:25112}  CLINICAL DECISION MAKING: {clinical decision making:25114}  EVALUATION COMPLEXITY: {Evaluation complexity:25115}   GOALS: Goals reviewed with patient? {yes/no:20286}  SHORT TERM GOALS: Target date: ***  *** Baseline: Goal status: {GOALSTATUS:25110}  2.  *** Baseline:  Goal status: {GOALSTATUS:25110}  3.  *** Baseline:  Goal status: {GOALSTATUS:25110}  4.  *** Baseline:  Goal status: {GOALSTATUS:25110}  5.  *** Baseline:  Goal status: {GOALSTATUS:25110}  6.  *** Baseline:  Goal status: {GOALSTATUS:25110}  LONG TERM GOALS: Target date: ***  *** Baseline:  Goal status: {GOALSTATUS:25110}  2.  *** Baseline:  Goal status: {GOALSTATUS:25110}  3.  *** Baseline:  Goal status:  {GOALSTATUS:25110}  4.  *** Baseline:  Goal status: {GOALSTATUS:25110}  5.  *** Baseline:  Goal status: {GOALSTATUS:25110}  6.  *** Baseline:  Goal status: {GOALSTATUS:25110}  PLAN:  PT FREQUENCY: {rehab frequency:25116}  PT DURATION: {rehab duration:25117}  PLANNED INTERVENTIONS: {rehab planned interventions:25118::"Therapeutic exercises","Therapeutic activity","Neuromuscular re-education","Balance training","Gait training","Patient/Family education","Self Care","Joint mobilization"}  PLAN FOR NEXT SESSION: ***   Rebeca Valdivia, PT 11/30/2022, 8:08 AM

## 2022-12-01 ENCOUNTER — Encounter: Payer: 59 | Attending: Student | Admitting: Physical Therapy

## 2022-12-22 ENCOUNTER — Encounter: Payer: Self-pay | Admitting: Physical Therapy

## 2022-12-29 ENCOUNTER — Encounter: Payer: Self-pay | Admitting: Physical Therapy

## 2023-01-05 ENCOUNTER — Encounter: Payer: Self-pay | Admitting: Physical Therapy

## 2024-04-10 ENCOUNTER — Encounter (HOSPITAL_BASED_OUTPATIENT_CLINIC_OR_DEPARTMENT_OTHER): Payer: Self-pay | Admitting: Emergency Medicine

## 2024-04-10 ENCOUNTER — Ambulatory Visit (HOSPITAL_BASED_OUTPATIENT_CLINIC_OR_DEPARTMENT_OTHER)
Admission: EM | Admit: 2024-04-10 | Discharge: 2024-04-10 | Disposition: A | Discharge status: Home / Self Care | Attending: Family Medicine | Admitting: Family Medicine

## 2024-04-10 DIAGNOSIS — J069 Acute upper respiratory infection, unspecified: Secondary | ICD-10-CM

## 2024-04-10 DIAGNOSIS — J029 Acute pharyngitis, unspecified: Secondary | ICD-10-CM

## 2024-04-10 DIAGNOSIS — H66001 Acute suppurative otitis media without spontaneous rupture of ear drum, right ear: Secondary | ICD-10-CM | POA: Diagnosis not present

## 2024-04-10 HISTORY — DX: Endometriosis, unspecified: N80.9

## 2024-04-10 HISTORY — DX: Allergy, unspecified, initial encounter: T78.40XA

## 2024-04-10 LAB — POCT RAPID STREP A (OFFICE): Rapid Strep A Screen: NEGATIVE

## 2024-04-10 MED ORDER — PREDNISONE 20 MG PO TABS: 20.0000 mg | ORAL_TABLET | Freq: Every day | ORAL | 0 refills | Status: AC

## 2024-04-10 MED ORDER — CEFDINIR 300 MG PO CAPS: 300.0000 mg | ORAL_CAPSULE | Freq: Two times a day (BID) | ORAL | 0 refills | Status: AC

## 2024-04-10 NOTE — ED Triage Notes (Signed)
 Pt c/o hoarseness,  shortness of breath due to nasal congestion started Saturday she states it started as just allergies that she gets every year but got worse on Saturday pt has tried Sinus rinses but no improvement.

## 2024-04-10 NOTE — Discharge Instructions (Addendum)
 Rapid strep was negative.  Has a viral upper respiratory infection and right otitis media or ear infection.  Will treat with prednisone , 20 mg daily for 5 days.  Will also provide cefdinir , 300 mg twice daily for 7 days.  Encouraged use of sinus rinses.  Get plenty of fluids and rest.  Follow-up if symptoms do not improve, worsen or new symptoms occur.  Would benefit from 3-week ear recheck here or with family doctor.

## 2024-04-10 NOTE — ED Provider Notes (Signed)
 [facility]    CSN: [id number] Arrival date & time: 04/10/24  1124      History   Chief Complaint Chief Complaint  Patient presents with   Nasal Congestion   Hoarse    HPI Claudia Fuller is a 38 y.o. female.   Patient is here for her mother brought her since she was sick.  She reports she had head congestion and runny nose and sneezing since 03/30/2024.  On 04/06/2024 she started with hoarseness, cough and some shortness of breath.  She has a sore throat and intermittently does not have a voice.  She did sinus rinses last night and this morning.  She takes allergy medications year-round.  She thought this was allergies initially but she feels much worse and she has also had some sinus pressure in the maxillary area.  She denies fever, nausea, vomiting, constipation, diarrhea.     Past Medical History:  Diagnosis Date   Allergies    Endometriosis     There are no active problems to display for this patient.   Past Surgical History:  Procedure Laterality Date   TOTAL ABDOMINAL HYSTERECTOMY      OB History   No obstetric history on file.      Home Medications    Prior to Admission medications   Medication Sig Start Date End Date Taking? Authorizing Provider  cefdinir  (OMNICEF ) 300 MG capsule Take 1 capsule (300 mg total) by mouth 2 (two) times daily for 7 days. 04/10/24 04/17/24 Yes Guss Legacy, FNP  predniSONE  (DELTASONE ) 20 MG tablet Take 1 tablet (20 mg total) by mouth daily with breakfast for 5 days. 04/10/24 04/15/24 Yes Guss Legacy, FNP    Family History History reviewed. No pertinent family history.  Social History Social History   Tobacco Use   Smoking status: Never   Smokeless tobacco: Never  Vaping Use   Vaping status: Every Day  Substance Use Topics   Alcohol use: Not Currently   Drug use: Never     Allergies   Patient has no known allergies.   Review of Systems Review of Systems  Constitutional:  Negative for chills and  fever.  HENT:  Positive for congestion, postnasal drip, rhinorrhea, sinus pressure, sinus pain, sore throat and voice change. Negative for ear pain.   Eyes:  Negative for pain and visual disturbance.  Respiratory:  Positive for cough. Negative for shortness of breath.   Cardiovascular:  Negative for chest pain and palpitations.  Gastrointestinal:  Negative for abdominal pain, constipation, diarrhea, nausea and vomiting.  Genitourinary:  Negative for dysuria and hematuria.  Musculoskeletal:  Negative for arthralgias and back pain.  Skin:  Negative for color change and rash.  Neurological:  Negative for seizures and syncope.  All other systems reviewed and are negative.    Physical Exam Triage Vital Signs ED Triage Vitals  Encounter Vitals Group     BP 04/10/24 1137 115/81     Systolic BP Percentile --      Diastolic BP Percentile --      Pulse Rate 04/10/24 1137 94     Resp 04/10/24 1137 18     Temp 04/10/24 1137 97.9 F (36.6 C)     Temp Source 04/10/24 1137 Oral     SpO2 04/10/24 1137 97 %     Weight --      Height --      Head Circumference --      Peak Flow --  Pain Score 04/10/24 1135 0     Pain Loc --      Pain Education --      Exclude from Growth Chart --    No data found.  Updated Vital Signs BP 115/81 (BP Location: Right Arm)   Pulse 94   Temp 97.9 F (36.6 C) (Oral)   Resp 18   LMP 08/27/2016 (Exact Date)   SpO2 97%   Visual Acuity Right Eye Distance:   Left Eye Distance:   Bilateral Distance:    Right Eye Near:   Left Eye Near:    Bilateral Near:     Physical Exam Vitals and nursing note reviewed.  Constitutional:      General: She is not in acute distress.    Appearance: She is well-developed. She is not ill-appearing or toxic-appearing.  HENT:     Head: Normocephalic and atraumatic.     Right Ear: Hearing, ear canal and external ear normal. Tenderness present. A middle ear effusion is present. Tympanic membrane is erythematous and  bulging.     Left Ear: Hearing, ear canal and external ear normal. No tenderness.  No middle ear effusion. Tympanic membrane is bulging. Tympanic membrane is not erythematous.     Nose: Congestion and rhinorrhea present. Rhinorrhea is clear.     Right Sinus: Maxillary sinus tenderness present. No frontal sinus tenderness.     Left Sinus: Maxillary sinus tenderness present. No frontal sinus tenderness.     Mouth/Throat:     Lips: Pink.     Mouth: Mucous membranes are moist.     Pharynx: Uvula midline. Posterior oropharyngeal erythema (Moderate erythema but no swelling.) present. No oropharyngeal exudate.     Tonsils: No tonsillar exudate.     Comments: Tonsils are very small and hard to see. Eyes:     Conjunctiva/sclera: Conjunctivae normal.     Pupils: Pupils are equal, round, and reactive to light.  Cardiovascular:     Rate and Rhythm: Normal rate and regular rhythm.     Heart sounds: S1 normal and S2 normal. No murmur heard. Pulmonary:     Effort: Pulmonary effort is normal. No respiratory distress.     Breath sounds: Normal breath sounds. No decreased breath sounds, wheezing, rhonchi or rales.  Abdominal:     General: Bowel sounds are normal.     Palpations: Abdomen is soft.     Tenderness: There is no abdominal tenderness.  Musculoskeletal:        General: No swelling.     Cervical back: Neck supple.  Lymphadenopathy:     Head:     Right side of head: No submental, submandibular, tonsillar, preauricular or posterior auricular adenopathy.     Left side of head: No submental, submandibular, tonsillar, preauricular or posterior auricular adenopathy.     Cervical: Cervical adenopathy present.     Right cervical: Superficial cervical adenopathy present.     Left cervical: Superficial cervical adenopathy present.  Skin:    General: Skin is warm and dry.     Capillary Refill: Capillary refill takes less than 2 seconds.     Findings: No rash.  Neurological:     Mental Status: She  is alert and oriented to person, place, and time.  Psychiatric:        Mood and Affect: Mood normal.      UC Treatments / Results  Labs (all labs ordered are listed, but only abnormal results are displayed) Labs Reviewed  POCT RAPID STREP A (OFFICE) -  Normal    EKG   Radiology No results found.  Procedures Procedures (including critical care time)  Medications Ordered in UC Medications - No data to display  Initial Impression / Assessment and Plan / UC Course  I have reviewed the triage vital signs and the nursing notes.  Pertinent labs & imaging results that were available during my care of the patient were reviewed by me and considered in my medical decision making (see chart for details).     Plan of Care: Right otitis media: Will treat with prednisone , 20 mg daily for 5 days.  Provided cefdinir , 300 mg twice daily for 7 days.  Encouraged to get a follow-up in 3 weeks to recheck her ear and be sure that the ear infection has resolved.  Get plenty of fluids and rest. Sore throat: Rapid strep was negative.  She is going to be on antibiotics for the otitis media so throat culture not needed and not done. Viral upper respiratory infection: Symptoms are greater than 7 to 10 days duration.  She is afebrile.  COVID and flu testing not done.  May use decongestants or allergy medicine as needed for symptom management.  Encouraged sinus rinses.  Follow-up if symptoms do not improve, worsen or new symptoms occur.  Encouraged to get a 3-week recheck of her right ear. Final Clinical Impressions(s) / UC Diagnoses   Final diagnoses:  Sore throat  Non-recurrent acute suppurative otitis media of right ear without spontaneous rupture of tympanic membrane  Viral URI with cough     Discharge Instructions      Rapid strep was negative.  Has a viral upper respiratory infection and right otitis media or ear infection.  Will treat with prednisone , 20 mg daily for 5 days.  Will also  provide cefdinir , 300 mg twice daily for 7 days.  Encouraged use of sinus rinses.  Get plenty of fluids and rest.  Follow-up if symptoms do not improve, worsen or new symptoms occur.  Would benefit from 3-week ear recheck here or with family doctor.     ED Prescriptions     Medication Sig Dispense Auth. Provider   predniSONE  (DELTASONE ) 20 MG tablet Take 1 tablet (20 mg total) by mouth daily with breakfast for 5 days. 5 tablet Alfonsa Vaile, FNP   cefdinir  (OMNICEF ) 300 MG capsule Take 1 capsule (300 mg total) by mouth 2 (two) times daily for 7 days. 14 capsule Guss Legacy, FNP      PDMP not reviewed this encounter.   Guss Legacy, FNP 04/10/24 1213

## 2025-01-09 ENCOUNTER — Encounter (HOSPITAL_BASED_OUTPATIENT_CLINIC_OR_DEPARTMENT_OTHER): Payer: Self-pay

## 2025-01-09 ENCOUNTER — Ambulatory Visit (HOSPITAL_BASED_OUTPATIENT_CLINIC_OR_DEPARTMENT_OTHER)
Admission: EM | Admit: 2025-01-09 | Discharge: 2025-01-09 | Disposition: A | Discharge status: Home / Self Care | Payer: Self-pay | Attending: Family Medicine | Admitting: Family Medicine

## 2025-01-09 DIAGNOSIS — J029 Acute pharyngitis, unspecified: Secondary | ICD-10-CM

## 2025-01-09 LAB — POCT RAPID STREP A (OFFICE): Rapid Strep A Screen: NEGATIVE

## 2025-01-09 MED ORDER — PREDNISONE 20 MG PO TABS: 40.0000 mg | ORAL_TABLET | Freq: Every day | ORAL | 0 refills | Status: AC

## 2025-01-09 NOTE — ED Provider Notes (Signed)
 " [facility]    CSN: [id number] Arrival date & time: 01/09/25  0920      History   Chief Complaint Chief Complaint  Patient presents with   Sore Throat    HPI Claudia Fuller is a 40 y.o. female.   Pt states she woke up this morning with a sore throat and hoarseness. She is having a slightly runny nose and occ cough. She has not taken anything for her symptoms. A few sick contacts. Took oil of oregano.    Sore Throat    Past Medical History:  Diagnosis Date   Allergies    Endometriosis     There are no active problems to display for this patient.   Past Surgical History:  Procedure Laterality Date   TOTAL ABDOMINAL HYSTERECTOMY      OB History   No obstetric history on file.      Home Medications    Prior to Admission medications  Medication Sig Start Date End Date Taking? Authorizing Provider  predniSONE  (DELTASONE ) 20 MG tablet Take 2 tablets (40 mg total) by mouth daily with breakfast for 4 days. 01/09/25 01/13/25 Yes Adah Wilbert LABOR, FNP    Family History History reviewed. No pertinent family history.  Social History Social History[1]   Allergies   Patient has no known allergies.   Review of Systems Review of Systems See HPI  Physical Exam Triage Vital Signs ED Triage Vitals  Encounter Vitals Group     BP 01/09/25 0927 112/79     Girls Systolic BP Percentile --      Girls Diastolic BP Percentile --      Boys Systolic BP Percentile --      Boys Diastolic BP Percentile --      Pulse Rate 01/09/25 0927 89     Resp 01/09/25 0927 20     Temp 01/09/25 0927 98.9 F (37.2 C)     Temp Source 01/09/25 0927 Oral     SpO2 01/09/25 0927 99 %     Weight --      Height --      Head Circumference --      Peak Flow --      Pain Score 01/09/25 0926 6     Pain Loc --      Pain Education --      Exclude from Growth Chart --    No data found.  Updated Vital Signs BP 112/79 (BP Location: Right Arm)   Pulse 89   Temp 98.9 F (37.2  C) (Oral)   Resp 20   LMP 08/27/2016   SpO2 99%   Visual Acuity Right Eye Distance:   Left Eye Distance:   Bilateral Distance:    Right Eye Near:   Left Eye Near:    Bilateral Near:     Physical Exam Vitals and nursing note reviewed.  Constitutional:      General: She is not in acute distress.    Appearance: Normal appearance. She is not ill-appearing, toxic-appearing or diaphoretic.  HENT:     Head: Normocephalic and atraumatic.     Right Ear: Tympanic membrane and ear canal normal.     Left Ear: Tympanic membrane and ear canal normal.     Nose: Congestion present.     Mouth/Throat:     Pharynx: Oropharynx is clear. Posterior oropharyngeal erythema present.  Eyes:     Conjunctiva/sclera: Conjunctivae normal.  Cardiovascular:     Rate and Rhythm: Normal rate and  regular rhythm.     Pulses: Normal pulses.     Heart sounds: Normal heart sounds.  Pulmonary:     Effort: Pulmonary effort is normal.     Breath sounds: Normal breath sounds.  Skin:    General: Skin is warm and dry.  Neurological:     Mental Status: She is alert.  Psychiatric:        Mood and Affect: Mood normal.      UC Treatments / Results  Labs (all labs ordered are listed, but only abnormal results are displayed) Labs Reviewed  POCT RAPID STREP A (OFFICE) - Normal    EKG   Radiology No results found.  Procedures Procedures (including critical care time)  Medications Ordered in UC Medications - No data to display  Initial Impression / Assessment and Plan / UC Course  I have reviewed the triage vital signs and the nursing notes.  Pertinent labs & imaging results that were available during my care of the patient were reviewed by me and considered in my medical decision making (see chart for details).     Viral illness-no concerns on exam. Prednisone  prescribed for sore throat and inflammation.   Recommend symptomatic treatment for relief of symptoms. Rest, hydrate and follow-up as  needed  Final Clinical Impressions(s) / UC Diagnoses   Final diagnoses:  Sore throat     Discharge Instructions      Your strep test was negative.  This is most likely something viral.  You can take over-the-counter medications like Zyrtec, Flonase,  Tylenol   and Motrin for pain as needed. Cough meds as needed.  I have given you a few days of some prednisone  to help with the sore throat.  Follow-up as needed     ED Prescriptions     Medication Sig Dispense Auth. Provider   predniSONE  (DELTASONE ) 20 MG tablet Take 2 tablets (40 mg total) by mouth daily with breakfast for 4 days. 8 tablet Adah Wilbert LABOR, FNP      PDMP not reviewed this encounter.     [1]  Social History Tobacco Use   Smoking status: Never   Smokeless tobacco: Never  Vaping Use   Vaping status: Every Day  Substance Use Topics   Alcohol use: Not Currently   Drug use: Never     Adah Wilbert LABOR, FNP 01/09/25 1025  "

## 2025-01-09 NOTE — ED Triage Notes (Signed)
 Pt states she woke up this morning with a sore throat and hoarseness. She is having a slightly runny nose and occ cough. She has not taken anything for her symptoms.

## 2025-01-09 NOTE — Discharge Instructions (Addendum)
 Your strep test was negative.  This is most likely something viral.  You can take over-the-counter medications like Zyrtec, Flonase,  Tylenol   and Motrin for pain as needed. Cough meds as needed.  I have given you a few days of some prednisone  to help with the sore throat.  Follow-up as needed
